# Patient Record
Sex: Female | Born: 1970 | Race: White | Hispanic: No | Smoking: Never smoker
Health system: Southern US, Community
[De-identification: ages and names within clinical notes are randomized; demographics above are authoritative.]

## PROBLEM LIST (undated history)

## (undated) DIAGNOSIS — R109 Unspecified abdominal pain: Secondary | ICD-10-CM

## (undated) DIAGNOSIS — R42 Dizziness and giddiness: Secondary | ICD-10-CM

## (undated) DIAGNOSIS — Z9221 Personal history of antineoplastic chemotherapy: Secondary | ICD-10-CM

## (undated) DIAGNOSIS — R51 Headache: Secondary | ICD-10-CM

## (undated) DIAGNOSIS — R519 Headache, unspecified: Secondary | ICD-10-CM

## (undated) DIAGNOSIS — J302 Other seasonal allergic rhinitis: Secondary | ICD-10-CM

## (undated) DIAGNOSIS — C189 Malignant neoplasm of colon, unspecified: Secondary | ICD-10-CM

## (undated) DIAGNOSIS — K921 Melena: Secondary | ICD-10-CM

## (undated) HISTORY — PX: ABDOMINAL HYSTERECTOMY: SHX81

---

## 2006-10-07 ENCOUNTER — Ambulatory Visit: Payer: Self-pay

## 2007-02-16 ENCOUNTER — Ambulatory Visit: Payer: Self-pay | Admitting: Obstetrics and Gynecology

## 2011-06-11 ENCOUNTER — Ambulatory Visit: Payer: Self-pay | Admitting: General Practice

## 2011-08-06 ENCOUNTER — Ambulatory Visit: Payer: Self-pay | Admitting: Obstetrics and Gynecology

## 2011-08-13 ENCOUNTER — Ambulatory Visit: Payer: Self-pay | Admitting: Obstetrics and Gynecology

## 2011-08-16 LAB — PATHOLOGY REPORT

## 2012-04-04 ENCOUNTER — Ambulatory Visit: Payer: Self-pay | Admitting: Obstetrics and Gynecology

## 2012-10-02 ENCOUNTER — Ambulatory Visit: Payer: Self-pay | Admitting: General Practice

## 2012-11-16 ENCOUNTER — Ambulatory Visit: Payer: Self-pay | Admitting: Physician Assistant

## 2013-04-10 ENCOUNTER — Ambulatory Visit: Payer: Self-pay | Admitting: Obstetrics and Gynecology

## 2013-07-02 ENCOUNTER — Ambulatory Visit: Payer: Self-pay | Admitting: General Practice

## 2014-04-11 ENCOUNTER — Ambulatory Visit: Payer: Self-pay | Admitting: Obstetrics and Gynecology

## 2014-12-15 DIAGNOSIS — C189 Malignant neoplasm of colon, unspecified: Secondary | ICD-10-CM

## 2014-12-15 HISTORY — DX: Malignant neoplasm of colon, unspecified: C18.9

## 2014-12-25 NOTE — Discharge Instructions (Signed)

## 2014-12-27 ENCOUNTER — Ambulatory Visit: Payer: PRIVATE HEALTH INSURANCE | Admitting: Anesthesiology

## 2014-12-27 ENCOUNTER — Encounter
Admission: RE | Disposition: A | Discharge status: Home / Self Care | Payer: Self-pay | Source: Ambulatory Visit | Attending: Gastroenterology

## 2014-12-27 ENCOUNTER — Ambulatory Visit
Admission: RE | Admit: 2014-12-27 | Discharge: 2014-12-27 | Disposition: A | Discharge status: Home / Self Care | Payer: PRIVATE HEALTH INSURANCE | Source: Ambulatory Visit | Attending: Gastroenterology | Admitting: Gastroenterology

## 2014-12-27 ENCOUNTER — Other Ambulatory Visit: Payer: Self-pay | Admitting: Gastroenterology

## 2014-12-27 DIAGNOSIS — R112 Nausea with vomiting, unspecified: Secondary | ICD-10-CM | POA: Insufficient documentation

## 2014-12-27 DIAGNOSIS — C187 Malignant neoplasm of sigmoid colon: Secondary | ICD-10-CM | POA: Insufficient documentation

## 2014-12-27 DIAGNOSIS — Z803 Family history of malignant neoplasm of breast: Secondary | ICD-10-CM | POA: Insufficient documentation

## 2014-12-27 DIAGNOSIS — Z79899 Other long term (current) drug therapy: Secondary | ICD-10-CM | POA: Diagnosis not present

## 2014-12-27 DIAGNOSIS — K2970 Gastritis, unspecified, without bleeding: Secondary | ICD-10-CM | POA: Insufficient documentation

## 2014-12-27 DIAGNOSIS — K921 Melena: Secondary | ICD-10-CM | POA: Diagnosis present

## 2014-12-27 DIAGNOSIS — R42 Dizziness and giddiness: Secondary | ICD-10-CM | POA: Insufficient documentation

## 2014-12-27 DIAGNOSIS — Z882 Allergy status to sulfonamides status: Secondary | ICD-10-CM | POA: Diagnosis not present

## 2014-12-27 DIAGNOSIS — R1013 Epigastric pain: Secondary | ICD-10-CM | POA: Diagnosis not present

## 2014-12-27 DIAGNOSIS — Z90710 Acquired absence of both cervix and uterus: Secondary | ICD-10-CM | POA: Diagnosis not present

## 2014-12-27 DIAGNOSIS — G4489 Other headache syndrome: Secondary | ICD-10-CM | POA: Insufficient documentation

## 2014-12-27 HISTORY — DX: Melena: K92.1

## 2014-12-27 HISTORY — PX: COLONOSCOPY: SHX5424

## 2014-12-27 HISTORY — DX: Dizziness and giddiness: R42

## 2014-12-27 HISTORY — DX: Headache: R51

## 2014-12-27 HISTORY — DX: Unspecified abdominal pain: R10.9

## 2014-12-27 HISTORY — DX: Headache, unspecified: R51.9

## 2014-12-27 HISTORY — PX: ESOPHAGOGASTRODUODENOSCOPY: SHX5428

## 2014-12-27 HISTORY — DX: Other seasonal allergic rhinitis: J30.2

## 2014-12-27 SURGERY — COLONOSCOPY
Anesthesia: General | Wound class: Contaminated

## 2014-12-27 MED ORDER — STERILE WATER FOR IRRIGATION IR SOLN
Status: DC | PRN
  Administered 2014-12-27: 11:00:00

## 2014-12-27 MED ORDER — LACTATED RINGERS IV SOLN
INTRAVENOUS | Status: DC
  Administered 2014-12-27 (×2): via INTRAVENOUS

## 2014-12-27 MED ORDER — PROPOFOL 10 MG/ML IV BOLUS
INTRAVENOUS | Status: DC | PRN
  Administered 2014-12-27 (×3): 30 mg via INTRAVENOUS
  Administered 2014-12-27 (×4): 20 mg via INTRAVENOUS

## 2014-12-27 MED ORDER — LIDOCAINE HCL (CARDIAC) 20 MG/ML IV SOLN
INTRAVENOUS | Status: DC | PRN
  Administered 2014-12-27: 30 mg via INTRAVENOUS

## 2014-12-27 SURGICAL SUPPLY — 38 items
BALLN DILATOR 10-12 8 (BALLOONS)
BALLN DILATOR 12-15 8 (BALLOONS)
BALLN DILATOR 15-18 8 (BALLOONS)
BALLN DILATOR CRE 0-12 8 (BALLOONS)
BALLN DILATOR ESOPH 8 10 CRE (MISCELLANEOUS) IMPLANT
BALLOON DILATOR 12-15 8 (BALLOONS) IMPLANT
BALLOON DILATOR 15-18 8 (BALLOONS) IMPLANT
BALLOON DILATOR CRE 0-12 8 (BALLOONS) IMPLANT
BLOCK BITE 60FR ADLT L/F GRN (MISCELLANEOUS) ×3 IMPLANT
CANISTER SUCT 1200ML W/VALVE (MISCELLANEOUS) ×3 IMPLANT
FCP ESCP3.2XJMB 240X2.8X (MISCELLANEOUS)
FORCEPS BIOP RAD 4 LRG CAP 4 (CUTTING FORCEPS) ×3 IMPLANT
FORCEPS BIOP RJ4 240 W/NDL (MISCELLANEOUS)
FORCEPS ESCP3.2XJMB 240X2.8X (MISCELLANEOUS) IMPLANT
GOWN CVR UNV OPN BCK APRN NK (MISCELLANEOUS) ×2 IMPLANT
GOWN ISOL THUMB LOOP REG UNIV (MISCELLANEOUS) ×4
HEMOCLIP INSTINCT (CLIP) IMPLANT
INJECTOR VARIJECT VIN23 (MISCELLANEOUS) ×1 IMPLANT
KIT CO2 TUBING (TUBING) ×3 IMPLANT
KIT DEFENDO VALVE AND CONN (KITS) IMPLANT
KIT ENDO PROCEDURE OLY (KITS) ×3 IMPLANT
LIGATOR MULTIBAND 6SHOOTER MBL (MISCELLANEOUS) IMPLANT
MARKER SPOT ENDO TATTOO 5ML (MISCELLANEOUS) ×1 IMPLANT
PAD GROUND ADULT SPLIT (MISCELLANEOUS) IMPLANT
SNARE SHORT THROW 13M SML OVAL (MISCELLANEOUS) IMPLANT
SNARE SHORT THROW 30M LRG OVAL (MISCELLANEOUS) IMPLANT
SPOT EX ENDOSCOPIC TATTOO (MISCELLANEOUS) ×2
SUCTION POLY TRAP 4CHAMBER (MISCELLANEOUS) IMPLANT
SYR INFLATION 60ML (SYRINGE) IMPLANT
TRAP SUCTION POLY (MISCELLANEOUS) IMPLANT
TUBING CONN 6MMX3.1M (TUBING)
TUBING SUCTION CONN 0.25 STRL (TUBING) IMPLANT
UNDERPAD 30X60 958B10 (PK) (MISCELLANEOUS) IMPLANT
VALVE BIOPSY ENDO (VALVE) IMPLANT
VARIJECT INJECTOR VIN23 (MISCELLANEOUS) ×3
WATER AUXILLARY (MISCELLANEOUS) IMPLANT
WATER STERILE IRR 500ML POUR (IV SOLUTION) ×3 IMPLANT
WIRE CRE 18-20MM 8CM F G (MISCELLANEOUS) IMPLANT

## 2014-12-27 NOTE — Transfer of Care (Signed)
Immediate Anesthesia Transfer of Care Note  Patient: CARILYN WOOLSTON  Procedure(s) Performed: Procedure(s) with comments: COLONOSCOPY (N/A) - cecum time- unable to reach ESOPHAGOGASTRODUODENOSCOPY (EGD) (N/A)  Patient Location: PACU  Anesthesia Type: General  Level of Consciousness: awake, alert  and patient cooperative  Airway and Oxygen Therapy: Patient Spontanous Breathing and Patient connected to supplemental oxygen  Post-op Assessment: Post-op Vital signs reviewed, Patient's Cardiovascular Status Stable, Respiratory Function Stable, Patent Airway and No signs of Nausea or vomiting  Post-op Vital Signs: Reviewed and stable  Complications: No apparent anesthesia complications

## 2014-12-27 NOTE — H&P (Signed)
  [facility]  [address], [place], [place] [address] Phone: [phone] Fax : [phone]  Primary Care Physician:  Laverta Baltimore, MD Primary Gastroenterologist:  Dr. Allen Norris  Pre-Procedure History & Physical: HPI:  Patricia Macias is a 44 y.o. female is here for an endoscopy and colonoscopy.   Past Medical History  Diagnosis Date  . Hematochezia   . Abdominal pain   . Seasonal allergies   . Vertigo     related to seasons  . Headache     stress related 2-3/week    Past Surgical History  Procedure Laterality Date  . Abdominal hysterectomy      partial    Prior to Admission medications   Medication Sig Start Date End Date Taking? Authorizing Provider  loratadine (CLARITIN) 10 MG tablet Take 10 mg by mouth as needed.    Yes Historical Provider, MD    Allergies as of 12/25/2014 - Review Complete 12/25/2014  Allergen Reaction Noted  . Sulfa antibiotics Hives 12/25/2014    History reviewed. No pertinent family history.  History   Social History  . Marital Status: Divorced    Spouse Name: N/A  . Number of Children: N/A  . Years of Education: N/A   Occupational History  . Not on file.   Social History Main Topics  . Smoking status: Never Smoker   . Smokeless tobacco: Not on file  . Alcohol Use: 1.2 - 1.8 oz/week    2-3 Glasses of wine per week  . Drug Use: Not on file  . Sexual Activity: Not on file   Other Topics Concern  . Not on file   Social History Narrative  . No narrative on file    Review of Systems: See HPI, otherwise negative ROS  Physical Exam: BP 121/84 mmHg  Pulse 82  Temp(Src) 98.2 F (36.8 C) (Temporal)  Resp 16  Ht 5\' 6"  (1.676 m)  Wt 197 lb 12.8 oz (89.721 kg)  BMI 31.94 kg/m2  SpO2 99% General:   Alert,  pleasant and cooperative in NAD Head:  Normocephalic and atraumatic. Neck:  Supple; no masses or thyromegaly. Lungs:  Clear throughout to auscultation.    Heart:  Regular rate and  rhythm. Abdomen:  Soft, nontender and nondistended. Normal bowel sounds, without guarding, and without rebound.   Neurologic:  Alert and  oriented x4;  grossly normal neurologically.  Impression/Plan: Patricia Macias is here for an endoscopy and colonoscopy to be performed for abd pain, dyspepsia, hemtochezina  Risks, benefits, limitations, and alternatives regarding  endoscopy and colonoscopy have been reviewed with the patient.  Questions have been answered.  All parties agreeable.   [facility], MD  12/27/2014, 11:09 AM

## 2014-12-27 NOTE — Op Note (Signed)
[facility] Patient Name: Patricia Macias Procedure Date: 12/27/2014 11:05 AM MRN: [id number] Account #: [id number] Date of Birth: [birth year] Admit Type: Outpatient Age: 44 Room: [facility] OR ROOM 01 Gender: Female Note Status: Finalized Procedure:         Colonoscopy Indications:       Hematochezia Providers:         Lucilla Lame, MD Referring MD:      Boykin Nearing, MD (Referring MD) Medicines:         Propofol per Anesthesia Complications:     No immediate complications. Procedure:         Pre-Anesthesia Assessment:                    - Prior to the procedure, a History and Physical was                     performed, and patient medications and allergies were                     reviewed. The patient's tolerance of previous anesthesia                     was also reviewed. The risks and benefits of the procedure                     and the sedation options and risks were discussed with the                     patient. All questions were answered, and informed consent                     was obtained. Prior Anticoagulants: The patient has taken                     no previous anticoagulant or antiplatelet agents. ASA                     Grade Assessment: II - A patient with mild systemic                     disease. After reviewing the risks and benefits, the                     patient was deemed in satisfactory condition to undergo                     the procedure.                    After obtaining informed consent, the colonoscope was                     passed under direct vision. Throughout the procedure, the                     patient's blood pressure, pulse, and oxygen saturations                     were monitored continuously. The Olympus CF H180AL                     colonoscope (S#: [id number]) was introduced through the anus  and advanced to the the sigmoid colon. The colonoscopy was   performed without difficulty. The patient tolerated the                     procedure well. The quality of the bowel preparation was                     excellent. Findings:      The perianal and digital rectal examinations were normal.      A fungating partially obstructing large mass was found in the sigmoid       colon. The mass was circumferential. Oozing was present. This was       biopsied with a cold forceps for histology. Area was successfully       injected with [place] ink for tattooing. 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 Removal was not done. Biopsied. Injected.                    - Scope could not be passed beyong the mass. Recommendation:    - Await pathology results.                    - Refer to a Psychologist, sport and exercise. Procedure Code(s): --- Professional ---                    [phone], Sigmoidoscopy, flexible; with directed submucosal                     injection(s), any substance                    [id number], Sigmoidoscopy, flexible; with biopsy, single or                     multiple Diagnosis Code(s): --- Professional ---                    K92.1, Melena                    D49.0, Neoplasm of unspecified behavior of digestive system CPT copyright 2014 American Medical Association. All rights reserved. The codes documented in this report are preliminary and upon coder review may  be revised to meet current compliance requirements. Lucilla Lame, MD 12/27/2014 11:40:11 AM This report has been signed electronically. Number of Addenda: 0 Note Initiated On: 12/27/2014 11:05 AM Total Procedure Duration: 0 hours 8 minutes 12 seconds       [facility]

## 2014-12-27 NOTE — Anesthesia Preprocedure Evaluation (Addendum)
Anesthesia Evaluation  Patient identified by MRN, date of birth, ID band Patient awake    Reviewed: Allergy & Precautions, NPO status , Patient's Chart, lab work & pertinent test results  Airway Mallampati: II  TM Distance: >3 FB Neck ROM: Full    Dental no notable dental hx.    Pulmonary neg pulmonary ROS,  breath sounds clear to auscultation  Pulmonary exam normal       Cardiovascular negative cardio ROS Normal cardiovascular examRhythm:Regular Rate:Normal     Neuro/Psych negative neurological ROS  negative psych ROS   GI/Hepatic negative GI ROS, Neg liver ROS,   Endo/Other  negative endocrine ROS  Renal/GU negative Renal ROS  negative genitourinary   Musculoskeletal negative musculoskeletal ROS (+)   Abdominal   Peds negative pediatric ROS (+)  Hematology negative hematology ROS (+)   Anesthesia Other Findings   Reproductive/Obstetrics negative OB ROS                            Anesthesia Physical Anesthesia Plan  ASA: II  Anesthesia Plan: General   Post-op Pain Management:    Induction: Intravenous  Airway Management Planned:   Additional Equipment:   Intra-op Plan:   Post-operative Plan: Extubation in OR  Informed Consent: I have reviewed the patients History and Physical, chart, labs and discussed the procedure including the risks, benefits and alternatives for the proposed anesthesia with the patient or authorized representative who has indicated his/her understanding and acceptance.   Dental advisory given  Plan Discussed with: CRNA  Anesthesia Plan Comments:         Anesthesia Quick Evaluation

## 2014-12-27 NOTE — Anesthesia Postprocedure Evaluation (Signed)
  Anesthesia Post-op Note  Patient: Patricia Macias  Procedure(s) Performed: Procedure(s) with comments: COLONOSCOPY (N/A) - cecum time- unable to reach ESOPHAGOGASTRODUODENOSCOPY (EGD) (N/A)  Anesthesia type:General  Patient location: PACU  Post pain: Pain level controlled  Post assessment: Post-op Vital signs reviewed, Patient's Cardiovascular Status Stable, Respiratory Function Stable, Patent Airway and No signs of Nausea or vomiting  Post vital signs: Reviewed and stable  Last Vitals:  Filed Vitals:   12/27/14 1153  BP:   Pulse:   Temp:   Resp: 14    Level of consciousness: awake, alert  and patient cooperative  Complications: No apparent anesthesia complications

## 2014-12-27 NOTE — Op Note (Signed)
[facility] Patient Name: Patricia Macias Procedure Date: 12/27/2014 11:06 AM MRN: [id number] Account #: [id number] Date of Birth: [birth year] Admit Type: Outpatient Age: 44 Room: [facility] OR ROOM 01 Gender: Female Note Status: Finalized Procedure:         Upper GI endoscopy Indications:       Nausea with vomiting Providers:         Lucilla Lame, MD Referring MD:      Boykin Nearing, MD (Referring MD) Medicines:         Propofol per Anesthesia Complications:     No immediate complications. Procedure:         Pre-Anesthesia Assessment:                    - Prior to the procedure, a History and Physical was                     performed, and patient medications and allergies were                     reviewed. The patient's tolerance of previous anesthesia                     was also reviewed. The risks and benefits of the procedure                     and the sedation options and risks were discussed with the                     patient. All questions were answered, and informed consent                     was obtained. Prior Anticoagulants: The patient has taken                     no previous anticoagulant or antiplatelet agents. ASA                     Grade Assessment: II - A patient with mild systemic                     disease. After reviewing the risks and benefits, the                     patient was deemed in satisfactory condition to undergo                     the procedure.                    After obtaining informed consent, the endoscope was passed                     under direct vision. Throughout the procedure, the                     patient's blood pressure, pulse, and oxygen saturations                     were monitored continuously. The Olympus GIF H180J                     colonscope (T#:[id number]) was introduced through the mouth,  and advanced to the second part of duodenum. The upper GI      endoscopy was accomplished without difficulty. The patient                     tolerated the procedure well. Findings:      The examined esophagus was normal.      Localized minimal inflammation characterized by erythema was found in       the gastric antrum. Biopsies were taken with a cold forceps for       histology.      The examined duodenum was normal. Biopsies were taken with a cold       forceps for histology. Impression:        - Normal esophagus.                    - Gastritis. Biopsied.                    - Normal examined duodenum. Biopsied. Recommendation:    - Await pathology results.                    - Perform a colonoscopy today. Procedure Code(s): --- Professional ---                    [phone], Esophagogastroduodenoscopy, flexible, transoral;                     with biopsy, single or multiple Diagnosis Code(s): --- Professional ---                    R11.2, Nausea with vomiting, unspecified                    K29.70, Gastritis, unspecified, without bleeding CPT copyright 2014 American Medical Association. All rights reserved. The codes documented in this report are preliminary and upon coder review may  be revised to meet current compliance requirements. Lucilla Lame, MD 12/27/2014 11:27:04 AM This report has been signed electronically. Number of Addenda: 0 Note Initiated On: 12/27/2014 11:06 AM Total Procedure Duration: 0 hours 3 minutes 35 seconds       [facility]

## 2014-12-30 ENCOUNTER — Encounter: Payer: Self-pay | Admitting: Gastroenterology

## 2015-01-02 DIAGNOSIS — C787 Secondary malignant neoplasm of liver and intrahepatic bile duct: Secondary | ICD-10-CM

## 2015-01-02 DIAGNOSIS — C189 Malignant neoplasm of colon, unspecified: Secondary | ICD-10-CM | POA: Insufficient documentation

## 2015-01-04 IMAGING — CT CT STONE STUDY
1 of 2 series · 14 of 32 positions shown, 18 images · non-contrast
Comparison: none

REASON FOR EXAM: hematuria
COMMENTS:

[Series 2: abd 3mm wo 3.0 i40f 3 · axial · 0.78mm/px · z∈[-106,+324]mm · 14 of 161 slices shown, 18 images]
[im 12/161  soft-tissue]
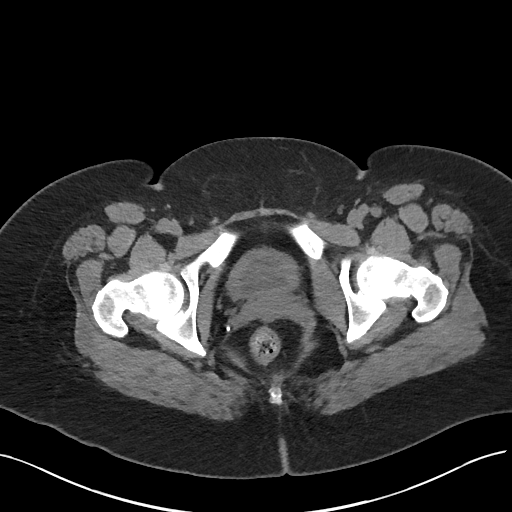
[im 12/161  bone]
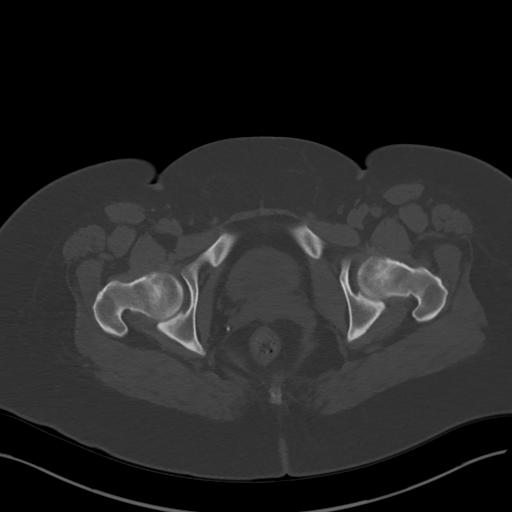
[im 24/161  soft-tissue]
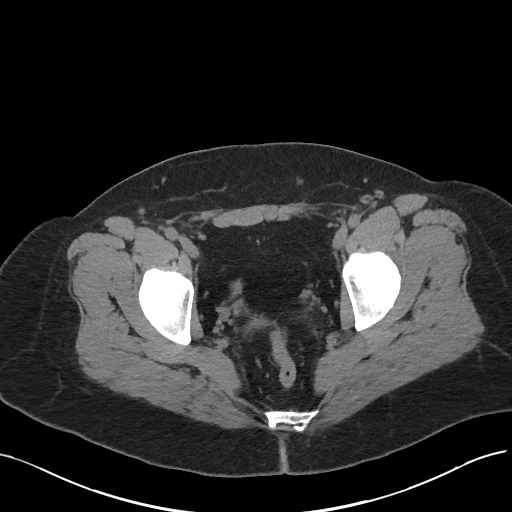
[im 36/161  soft-tissue]
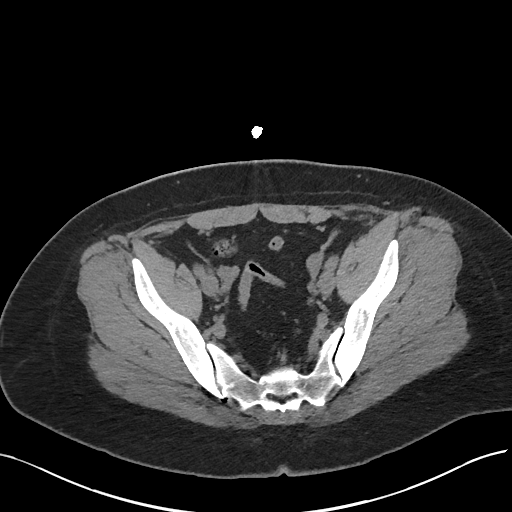
[im 48/161  soft-tissue]
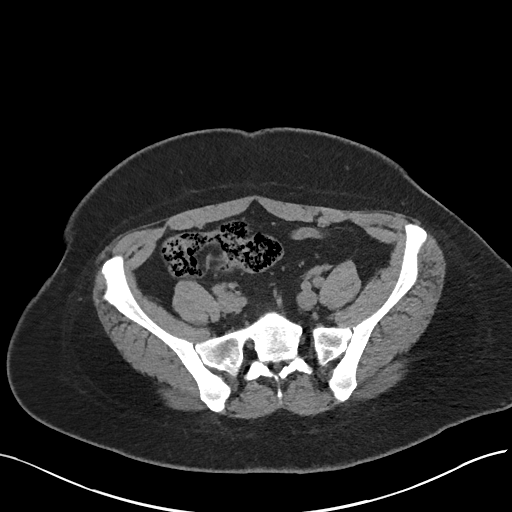
[im 60/161  soft-tissue]
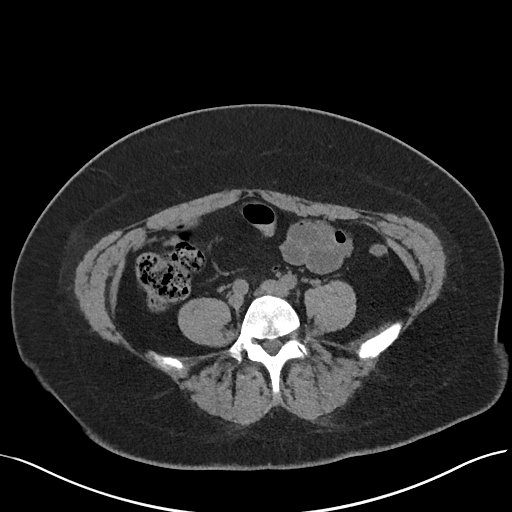
[im 72/161  soft-tissue]
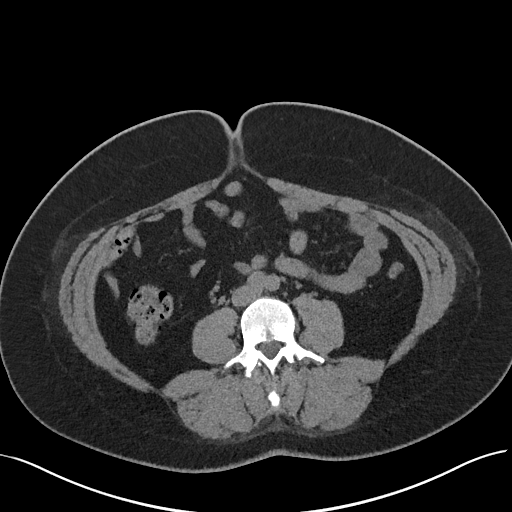
[im 89/161  soft-tissue]
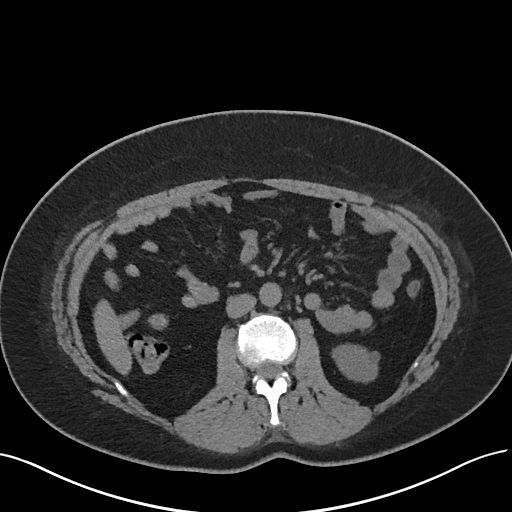
[im 101/161  soft-tissue]
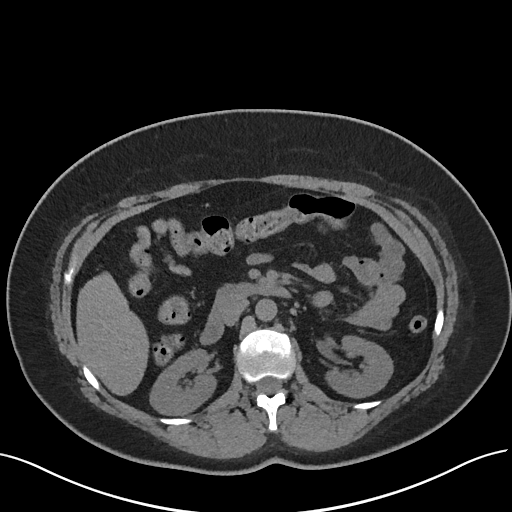
[im 113/161  soft-tissue]
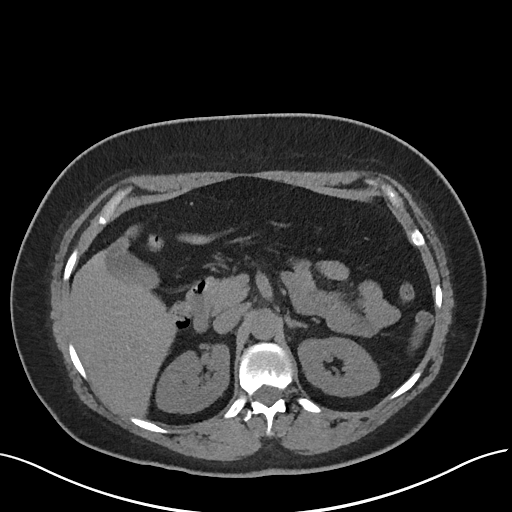
[im 113/161  bone]
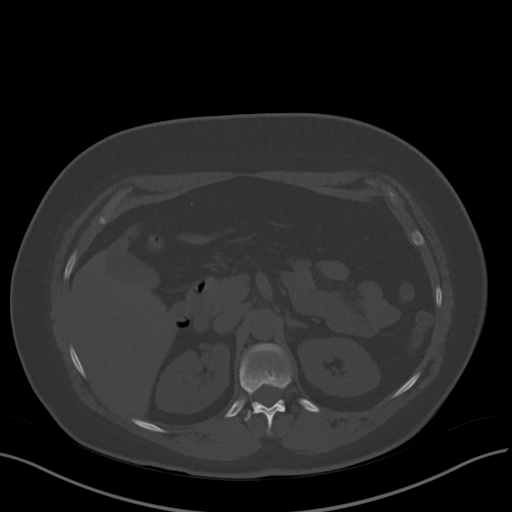
[im 125/161  soft-tissue]
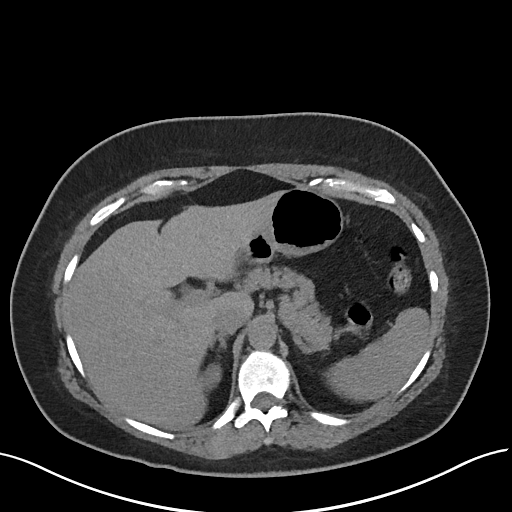
[im 137/161  soft-tissue]
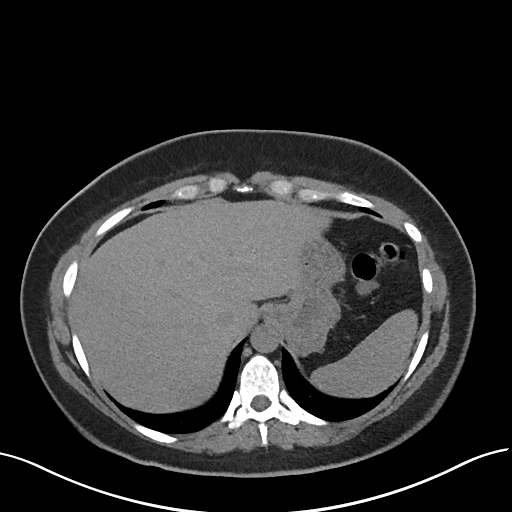
[im 137/161  lung]
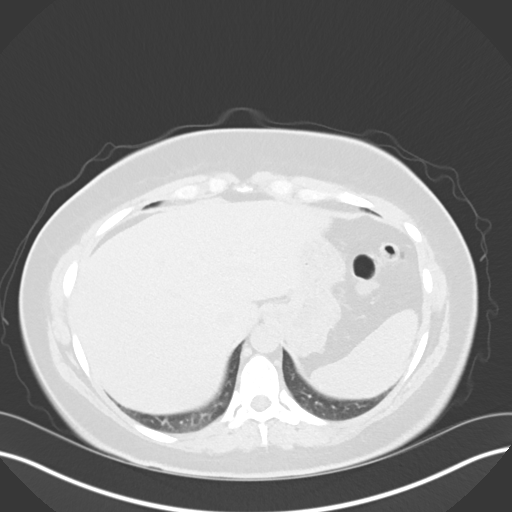
[im 143/161  lung]
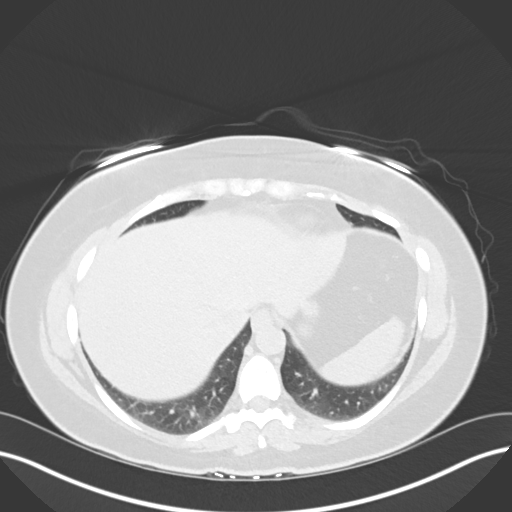
[im 149/161  soft-tissue]
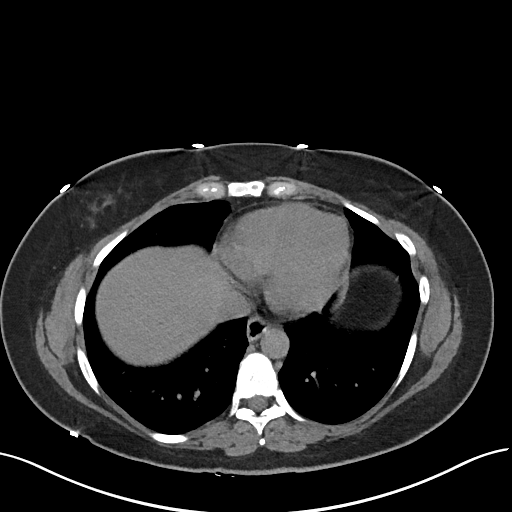
[im 149/161  lung]
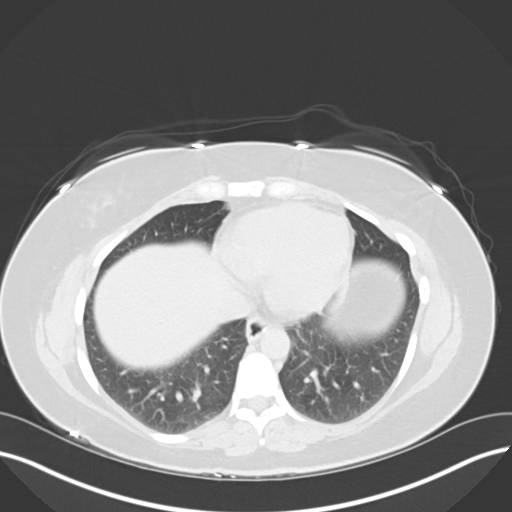
[im 155/161  lung]
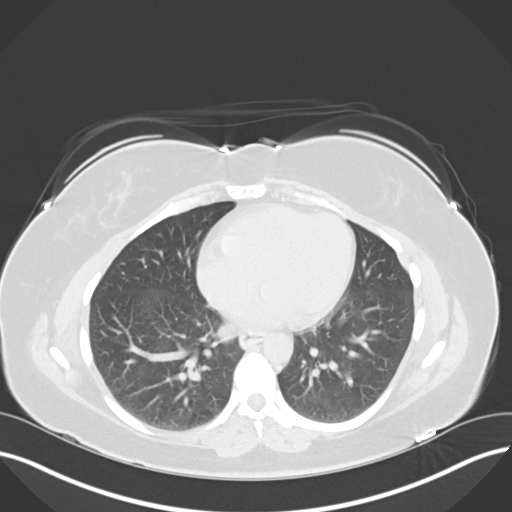

[14 of 32 positions shown; findings below may reference images not displayed]

PROCEDURE:     KCT - KCT ABDOMEN/PELVIS WO (STONE)  - October 02, 2012  [DATE]

RESULT:     Axial noncontrast CT scanning was performed through the abdomen
and pelvis with reconstructions at 3 mm intervals and slice thicknesses.
Review of multiplanar reconstructed images was performed separately on the
VIA monitor.

The kidneys are normal in contour. There is an 8mm diameter exophytic
hypodensity from the lower pole of the left kidney with Hounsfield
measurement of -18. There are no calcified stones within either renal
collecting system. The perinephric fat is normal in density. No abnormal
parenchymal densities are demonstrated. The ureters are normal in course and
caliber. The urinary bladder is decompressed. No ureterovesical junction
stone is demonstrated.

The uterus is surgically absent. There are no adnexal masses. There is a 2
cm diameter cyst associated with the right ovary.

The liver, pancreas, spleen, nondistended stomach, and adrenal glands are
normal in appearance. There is subtle radiodensity in the gallbladder which
may reflect sludge. No discrete calcified stones are demonstrated. The
caliber of the abdominal aorta is normal. The psoas musculature also appears
normal. The unopacified loops of small and large bowel exhibit no evidence
of ileus nor of obstruction. A normal calibered partially gas-filled
appendix is demonstrated on images 104 through 109. There is a small fat
containing umbilical hernia. There is no inguinal hernia. The lumbar
vertebral bodies are preserved in height. The lung bases are clear.
IMPRESSION: 1. There is no evidence of urinary tract obstruction nor of calcified
urinary tract stones. There is a tiny exophytic structure from the lower
pole of the left kidney measuring 8 mm in diameter with Hounsfield
measurement of -18 most compatible with a cyst. No abnormality of the
ureters and urinary bladder is demonstrated but the urinary bladder is
collapsed.
2. There is likely 2 cm diameter right ovarian cyst.
3. There is no acute bowel abnormality.
4. I cannot exclude sludge within the gallbladder.

If the patient's hematuria persists and remains unexplained, followup
triphasic CT scanning may be indicated.

[REDACTED]

## 2015-05-16 ENCOUNTER — Ambulatory Visit: Payer: Self-pay | Admitting: Physician Assistant

## 2015-05-16 ENCOUNTER — Encounter: Payer: Self-pay | Admitting: Physician Assistant

## 2015-05-16 VITALS — BP 110/70 | HR 84 | Temp 97.9°F

## 2015-05-16 DIAGNOSIS — J302 Other seasonal allergic rhinitis: Secondary | ICD-10-CM

## 2015-05-16 DIAGNOSIS — N390 Urinary tract infection, site not specified: Secondary | ICD-10-CM

## 2015-05-16 DIAGNOSIS — R319 Hematuria, unspecified: Principal | ICD-10-CM

## 2015-05-16 LAB — POCT URINALYSIS DIPSTICK
Glucose, UA: NEGATIVE
Ketones, UA: NEGATIVE
Nitrite, UA: NEGATIVE
PH UA: 6
Spec Grav, UA: >=1.03
Urobilinogen, UA: 0.2

## 2015-05-16 MED ORDER — CIPROFLOXACIN HCL 250 MG PO TABS: 250.0000 mg | ORAL_TABLET | Freq: Two times a day (BID) | ORAL | Status: DC

## 2015-05-16 MED ORDER — LORATADINE 10 MG PO TABS
10.0000 mg | ORAL_TABLET | Freq: Every day | ORAL | Status: DC | PRN
Start: 2015-05-16 — End: 2016-02-05

## 2015-05-16 NOTE — Progress Notes (Signed)
S:  C/o uti sx for 2 days, burning, urgency, frequency, denies vaginal discharge, abdominal pain or flank pain:  Remainder ros neg  O:  Vitals wnl, nad, no cva tenderness, back nontender, lungs c t a,cv rrr,  n/v intact  A: uti  P: cipro 250mg  bid x 7d, increase water intake, add cranberry juice, return if not improving in 2 -3 days, return earlier if worsening, discussed pyelonephritis sx

## 2015-05-16 NOTE — Addendum Note (Signed)
Addended by: Versie Starks on: 05/16/2015 03:51 PM   Modules accepted: Orders

## 2015-07-25 ENCOUNTER — Encounter: Payer: Self-pay | Admitting: Physician Assistant

## 2015-07-25 ENCOUNTER — Ambulatory Visit: Payer: Self-pay | Admitting: Physician Assistant

## 2015-07-25 VITALS — BP 120/70 | HR 104 | Temp 97.8°F

## 2015-07-25 DIAGNOSIS — T148XXA Other injury of unspecified body region, initial encounter: Principal | ICD-10-CM

## 2015-07-25 DIAGNOSIS — L089 Local infection of the skin and subcutaneous tissue, unspecified: Secondary | ICD-10-CM

## 2015-07-25 MED ORDER — CEPHALEXIN 500 MG PO CAPS: 500.0000 mg | ORAL_CAPSULE | Freq: Three times a day (TID) | ORAL | Status: DC

## 2015-07-25 NOTE — Progress Notes (Signed)
S: pt here for eval of suture line, thinks its infected, had part of her liver removed in November, area had been healing well, now has 2 open areas that are draining, area surrounding this area is warm and red, pt also has colostomy bag, denies fever/chills, is just really tired  O: vitals wnl, nad, skin with healing incision except for area near umbilicus, area has opened and is draining , skin surrounding is pink and warm, a little tender  A: infected wound vs reaction to desolvable sutures  P: keflex 500mg  tid, f/u with surgeon on Mon am

## 2015-10-30 ENCOUNTER — Encounter: Payer: Self-pay | Admitting: Physician Assistant

## 2015-10-30 ENCOUNTER — Ambulatory Visit: Payer: Self-pay | Admitting: Physician Assistant

## 2015-10-30 VITALS — BP 119/79 | HR 88 | Temp 98.5°F

## 2015-10-30 DIAGNOSIS — L089 Local infection of the skin and subcutaneous tissue, unspecified: Secondary | ICD-10-CM

## 2015-10-30 DIAGNOSIS — T148XXA Other injury of unspecified body region, initial encounter: Secondary | ICD-10-CM

## 2015-10-30 DIAGNOSIS — G4700 Insomnia, unspecified: Secondary | ICD-10-CM

## 2015-10-30 DIAGNOSIS — L0291 Cutaneous abscess, unspecified: Secondary | ICD-10-CM

## 2015-10-30 MED ORDER — ZOLPIDEM TARTRATE 10 MG PO TABS: 10.0000 mg | ORAL_TABLET | Freq: Every evening | ORAL | Status: DC | PRN

## 2015-10-30 MED ORDER — CEPHALEXIN 500 MG PO CAPS: 500.0000 mg | ORAL_CAPSULE | Freq: Three times a day (TID) | ORAL | Status: DC

## 2015-10-30 NOTE — Progress Notes (Signed)
S: has open oozing area on r upper thigh, a hard white area had been there for years then turned black with the chemo, now its red and oozing, denies fever chills, last chemo was 3 weeks ago, also states her Lorrin Mais has stopped working, is on 5mg  qhs  O: vitals wnl, nad, skin with open oozing area, when area pressed cheesy white material typical of sebaceous cyst comes out, has slight odor typical of sebaceous cyst, n/v intact, bandage applied  A: insomnia, abscess  P: ambien 10mg  qhs, #30 3 refills, keflex 500mg  tid, return if area is worsening, apply warm wet compress

## 2016-02-05 ENCOUNTER — Ambulatory Visit: Payer: Self-pay | Admitting: Physician Assistant

## 2016-02-05 ENCOUNTER — Encounter: Payer: Self-pay | Admitting: Physician Assistant

## 2016-02-05 VITALS — BP 130/90 | HR 90 | Temp 98.6°F

## 2016-02-05 DIAGNOSIS — G894 Chronic pain syndrome: Secondary | ICD-10-CM

## 2016-02-05 MED ORDER — OXYCODONE HCL 5 MG PO TABS: 5.0000 mg | ORAL_TABLET | Freq: Four times a day (QID) | ORAL | Status: AC | PRN

## 2016-02-05 NOTE — Progress Notes (Signed)
S: wound check, states been having a little pain around the wound as is having to use stairs since elevator is out of commission, getting ready to go on vacation and there will be a lot of stairs there, is concerned she will be in pain and need something, has had a refill on oxycodone since December  O: vitals wnl, nad, wound appears well approximated where colostomy bag was, no drainage, no redness, n/v intact  A: chronic pain  P: oxycodone 5mg  #15 nr

## 2016-03-10 ENCOUNTER — Other Ambulatory Visit: Payer: Self-pay

## 2016-03-10 DIAGNOSIS — Z299 Encounter for prophylactic measures, unspecified: Secondary | ICD-10-CM

## 2016-03-10 NOTE — Progress Notes (Signed)
Patient came in to have blood drawn per Dr. Ouida Sills at [facility] orders.

## 2016-03-11 LAB — LIPID PANEL
CHOL/HDL RATIO: 2.3 ratio (ref 0.0–4.4)
Cholesterol, Total: 158 mg/dL (ref 100–199)
HDL: 68 mg/dL (ref >39)
LDL Calculated: 75 mg/dL (ref 0–99)
Triglycerides: 74 mg/dL (ref 0–149)
VLDL Cholesterol Cal: 15 mg/dL (ref 5–40)

## 2016-03-11 LAB — BASIC METABOLIC PANEL
BUN/Creatinine Ratio: 17 (ref 9–23)
BUN: 13 mg/dL (ref 6–24)
CHLORIDE: 101 mmol/L (ref 96–106)
CO2: 23 mmol/L (ref 18–29)
Calcium: 8.8 mg/dL (ref 8.7–10.2)
Creatinine, Ser: 0.76 mg/dL (ref 0.57–1.00)
GFR, EST AFRICAN AMERICAN: 110 mL/min/{1.73_m2} (ref >59)
GFR, EST NON AFRICAN AMERICAN: 96 mL/min/{1.73_m2} (ref >59)
Glucose: 82 mg/dL (ref 65–99)
POTASSIUM: 3.9 mmol/L (ref 3.5–5.2)
SODIUM: 139 mmol/L (ref 134–144)

## 2016-05-14 ENCOUNTER — Ambulatory Visit: Payer: Self-pay | Admitting: Physician Assistant

## 2016-05-14 ENCOUNTER — Encounter: Payer: Self-pay | Admitting: Physician Assistant

## 2016-05-14 VITALS — BP 130/80 | HR 87 | Temp 98.3°F

## 2016-05-14 DIAGNOSIS — J029 Acute pharyngitis, unspecified: Secondary | ICD-10-CM

## 2016-05-14 LAB — POCT RAPID STREP A (OFFICE): RAPID STREP A SCREEN: NEGATIVE

## 2016-05-14 MED ORDER — PSEUDOEPH-BROMPHEN-DM 30-2-10 MG/5ML PO SYRP: 5.0000 mL | ORAL_SOLUTION | Freq: Four times a day (QID) | ORAL | 0 refills | Status: DC | PRN

## 2016-05-14 NOTE — Progress Notes (Addendum)
   Subjective:    Patient ID: Patricia Macias, female    DOB: August 16, 1971, 45 y.o.   MRN: [id number]  HPI Patient c/o sore throat for 2 days. States post nasal drainage and cough for same amount of time. Denies dysphagia. Denies N/V/D. No palliative measure for compliant.   Review of Systems Negative except for compliant.    Objective:   Physical Exam HEENT remarkable for post nasal drainage and mild erythema to posterior pharynx. No cervical adenopathy. Lungs CTA and Heart RRR. Rapid Strep test was negative.       Assessment & Plan:  Viral pharyngitis. Patient given supportive care instructions and prescription for Bromfed DM. Follow up with [facility].

## 2016-05-14 NOTE — Addendum Note (Signed)
Addended by: Sable Feil on: 05/14/2016 11:00 AM   Modules accepted: Orders

## 2016-05-31 ENCOUNTER — Other Ambulatory Visit: Payer: Self-pay | Admitting: Physician Assistant

## 2016-05-31 ENCOUNTER — Other Ambulatory Visit: Payer: Self-pay | Admitting: Obstetrics and Gynecology

## 2016-05-31 DIAGNOSIS — Z1231 Encounter for screening mammogram for malignant neoplasm of breast: Secondary | ICD-10-CM

## 2016-05-31 DIAGNOSIS — G4700 Insomnia, unspecified: Secondary | ICD-10-CM

## 2016-06-02 ENCOUNTER — Ambulatory Visit
Admission: RE | Admit: 2016-06-02 | Discharge: 2016-06-02 | Disposition: A | Discharge status: Home / Self Care | Payer: Managed Care, Other (non HMO) | Source: Ambulatory Visit | Attending: Obstetrics and Gynecology | Admitting: Obstetrics and Gynecology

## 2016-06-02 DIAGNOSIS — Z1231 Encounter for screening mammogram for malignant neoplasm of breast: Secondary | ICD-10-CM | POA: Insufficient documentation

## 2016-06-02 DIAGNOSIS — R928 Other abnormal and inconclusive findings on diagnostic imaging of breast: Secondary | ICD-10-CM | POA: Insufficient documentation

## 2016-06-02 HISTORY — DX: Malignant neoplasm of colon, unspecified: C18.9

## 2016-06-02 HISTORY — DX: Personal history of antineoplastic chemotherapy: Z92.21

## 2016-06-07 ENCOUNTER — Other Ambulatory Visit: Payer: Self-pay | Admitting: Obstetrics and Gynecology

## 2016-06-07 DIAGNOSIS — N6489 Other specified disorders of breast: Secondary | ICD-10-CM

## 2016-06-24 ENCOUNTER — Ambulatory Visit: Payer: Managed Care, Other (non HMO) | Attending: Obstetrics and Gynecology

## 2016-06-24 ENCOUNTER — Other Ambulatory Visit: Payer: Self-pay

## 2016-07-22 ENCOUNTER — Ambulatory Visit
Admission: RE | Admit: 2016-07-22 | Discharge: 2016-07-22 | Disposition: A | Discharge status: Home / Self Care | Payer: Managed Care, Other (non HMO) | Source: Ambulatory Visit | Attending: Obstetrics and Gynecology | Admitting: Obstetrics and Gynecology

## 2016-07-22 DIAGNOSIS — N6489 Other specified disorders of breast: Secondary | ICD-10-CM | POA: Insufficient documentation

## 2016-07-26 ENCOUNTER — Telehealth: Payer: Self-pay | Admitting: Emergency Medicine

## 2016-07-26 MED ORDER — FLUCONAZOLE 150 MG PO TABS: 150.0000 mg | ORAL_TABLET | Freq: Once | ORAL | 0 refills | Status: AC

## 2016-07-26 MED ORDER — CEFDINIR 300 MG PO CAPS: 300.0000 mg | ORAL_CAPSULE | Freq: Two times a day (BID) | ORAL | 0 refills | Status: DC

## 2016-07-26 NOTE — Telephone Encounter (Signed)
Pt called office and states she is having sx of sinus infection, is undergoing radiation treatments and is afraid to come to the office due to her immune system, ?if we could call in an antibiotic  Called in omnicef , dilfucan x [facility]

## 2016-07-26 NOTE — Telephone Encounter (Signed)
Patient called and expressed that she has developed a sinus infection.  She has a sorethroat, productive cough and congestion and has color in her mucus. She wants to know if we can call in an antibiotic.

## 2016-08-03 ENCOUNTER — Other Ambulatory Visit: Payer: Self-pay | Admitting: Obstetrics and Gynecology

## 2016-08-03 DIAGNOSIS — N63 Unspecified lump in unspecified breast: Secondary | ICD-10-CM

## 2016-09-08 ENCOUNTER — Other Ambulatory Visit: Payer: Self-pay

## 2016-09-08 DIAGNOSIS — Z299 Encounter for prophylactic measures, unspecified: Secondary | ICD-10-CM

## 2016-09-08 NOTE — Addendum Note (Signed)
Addended by: Rudene Anda T on: 09/08/2016 12:11 PM   Modules accepted: Level of Service

## 2016-09-08 NOTE — Progress Notes (Signed)
Patient came in to have labs done per Dr. Catalina Antigua Nelson's orders.

## 2016-09-10 LAB — URINE CULTURE

## 2016-11-03 ENCOUNTER — Ambulatory Visit: Payer: Self-pay | Admitting: Physician Assistant

## 2016-11-03 DIAGNOSIS — G4701 Insomnia due to medical condition: Secondary | ICD-10-CM

## 2016-11-03 MED ORDER — ZOLPIDEM TARTRATE 10 MG PO TABS: 10.0000 mg | ORAL_TABLET | Freq: Every evening | ORAL | 3 refills | Status: DC | PRN

## 2016-11-03 NOTE — Progress Notes (Signed)
S:  Refill medication.  Taking Ambien every night for sleep.  No other complaints A:  Insomnia P:  Ambien 10mg   #30 x 3

## 2016-11-17 ENCOUNTER — Ambulatory Visit: Payer: Self-pay | Admitting: Physician Assistant

## 2016-11-17 ENCOUNTER — Encounter: Payer: Self-pay | Admitting: Physician Assistant

## 2016-11-17 VITALS — BP 110/70 | HR 86 | Temp 98.4°F | Ht 66.0 in | Wt 175.0 lb

## 2016-11-17 DIAGNOSIS — Z0189 Encounter for other specified special examinations: Secondary | ICD-10-CM

## 2016-11-17 DIAGNOSIS — Z008 Encounter for other general examination: Secondary | ICD-10-CM

## 2016-11-17 DIAGNOSIS — N838 Other noninflammatory disorders of ovary, fallopian tube and broad ligament: Secondary | ICD-10-CM | POA: Insufficient documentation

## 2016-11-17 DIAGNOSIS — Z0000 Encounter for general adult medical examination without abnormal findings: Secondary | ICD-10-CM

## 2016-11-17 NOTE — Progress Notes (Signed)
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

## 2016-12-09 ENCOUNTER — Other Ambulatory Visit: Payer: Self-pay

## 2016-12-09 DIAGNOSIS — N390 Urinary tract infection, site not specified: Secondary | ICD-10-CM

## 2016-12-09 NOTE — Progress Notes (Signed)
Patient came in to have lab work done per Dr. Sharol Given orders.

## 2016-12-11 LAB — URINE CULTURE: ORGANISM ID, BACTERIA: NO GROWTH

## 2017-01-24 ENCOUNTER — Ambulatory Visit
Admission: RE | Admit: 2017-01-24 | Discharge: 2017-01-24 | Disposition: A | Discharge status: Home / Self Care | Payer: Managed Care, Other (non HMO) | Source: Ambulatory Visit | Attending: Obstetrics and Gynecology | Admitting: Obstetrics and Gynecology

## 2017-01-24 DIAGNOSIS — N63 Unspecified lump in unspecified breast: Secondary | ICD-10-CM

## 2017-01-24 DIAGNOSIS — N6320 Unspecified lump in the left breast, unspecified quadrant: Secondary | ICD-10-CM | POA: Insufficient documentation

## 2017-01-26 ENCOUNTER — Telehealth: Payer: Self-pay | Admitting: Emergency Medicine

## 2017-01-26 MED ORDER — VALACYCLOVIR HCL 1 G PO TABS: 1000.0000 mg | ORAL_TABLET | Freq: Two times a day (BID) | ORAL | 0 refills | Status: AC

## 2017-01-26 NOTE — Telephone Encounter (Signed)
Patient called and expressed that she has developed a fever blister.  Wanted to know if we can call something in to [facility] [place].

## 2017-03-14 ENCOUNTER — Other Ambulatory Visit: Payer: Self-pay

## 2017-03-14 DIAGNOSIS — G4701 Insomnia due to medical condition: Secondary | ICD-10-CM

## 2017-03-14 MED ORDER — ZOLPIDEM TARTRATE 10 MG PO TABS: 10.0000 mg | ORAL_TABLET | Freq: Every evening | ORAL | 3 refills | Status: AC | PRN

## 2017-03-14 NOTE — Progress Notes (Signed)
S: pt here for labs, has stopped doing any chemo treatments, is going to try a "natural way" as she doesn't want to live on chemotherapy for the remaining time she has; uses ambien to sleep, ?if could get refill, is here for labs today bc they will be replacing a stent to her liver this Friday  O: vitals wnl, nad, neuro and mood good  A: insomnia  P: ambien 10mg  qhs #30 3 refills

## 2017-03-14 NOTE — Addendum Note (Signed)
Addended by: Rudene Anda T on: 03/14/2017 10:06 AM   Modules accepted: Orders

## 2017-03-15 LAB — CMP12+LP+TP+TSH+6AC+CBC/D/PLT
ALT: 12 IU/L (ref 0–32)
AST: 25 IU/L (ref 0–40)
Albumin/Globulin Ratio: 1.1 — ABNORMAL LOW (ref 1.2–2.2)
Albumin: 3.9 g/dL (ref 3.5–5.5)
Alkaline Phosphatase: 103 IU/L (ref 39–117)
BUN/Creatinine Ratio: 15 (ref 9–23)
BUN: 13 mg/dL (ref 6–24)
Basophils Absolute: 0 x10E3/uL (ref 0.0–0.2)
Basos: 1 %
Bilirubin Total: 0.5 mg/dL (ref 0.0–1.2)
Calcium: 9.4 mg/dL (ref 8.7–10.2)
Chloride: 99 mmol/L (ref 96–106)
Chol/HDL Ratio: 3.1 ratio (ref 0.0–4.4)
Cholesterol, Total: 167 mg/dL (ref 100–199)
Creatinine, Ser: 0.84 mg/dL (ref 0.57–1.00)
EOS (ABSOLUTE): 0.1 x10E3/uL (ref 0.0–0.4)
Eos: 4 %
Estimated CHD Risk: <0.5 times avg. (ref 0.0–1.0)
Free Thyroxine Index: 1.9 (ref 1.2–4.9)
GFR calc Af Amer: 97 mL/min/1.73 (ref >59)
GFR calc non Af Amer: 84 mL/min/1.73 (ref >59)
GGT: 41 IU/L (ref 0–60)
Globulin, Total: 3.4 g/dL (ref 1.5–4.5)
Glucose: 94 mg/dL (ref 65–99)
HDL: 54 mg/dL (ref >39)
Hematocrit: 35.2 % (ref 34.0–46.6)
Hemoglobin: 11.6 g/dL (ref 11.1–15.9)
Immature Grans (Abs): 0 x10E3/uL (ref 0.0–0.1)
Immature Granulocytes: 0 %
Iron: 29 ug/dL (ref 27–159)
LDH: 287 IU/L — ABNORMAL HIGH (ref 119–226)
LDL Calculated: 99 mg/dL (ref 0–99)
Lymphocytes Absolute: 0.5 x10E3/uL — ABNORMAL LOW (ref 0.7–3.1)
Lymphs: 14 %
MCH: 27.5 pg (ref 26.6–33.0)
MCHC: 33 g/dL (ref 31.5–35.7)
MCV: 83 fL (ref 79–97)
Monocytes Absolute: 0.2 x10E3/uL (ref 0.1–0.9)
Monocytes: 7 %
Neutrophils Absolute: 2.5 x10E3/uL (ref 1.4–7.0)
Neutrophils: 74 %
Phosphorus: 3.5 mg/dL (ref 2.5–4.5)
Platelets: 131 x10E3/uL — ABNORMAL LOW (ref 150–379)
Potassium: 3.4 mmol/L — ABNORMAL LOW (ref 3.5–5.2)
RBC: 4.22 x10E6/uL (ref 3.77–5.28)
RDW: 16.9 % — ABNORMAL HIGH (ref 12.3–15.4)
Sodium: 141 mmol/L (ref 134–144)
T3 Uptake Ratio: 33 % (ref 24–39)
T4, Total: 5.8 ug/dL (ref 4.5–12.0)
TSH: 3.89 u[IU]/mL (ref 0.450–4.500)
Total Protein: 7.3 g/dL (ref 6.0–8.5)
Triglycerides: 72 mg/dL (ref 0–149)
Uric Acid: 5.6 mg/dL (ref 2.5–7.1)
VLDL Cholesterol Cal: 14 mg/dL (ref 5–40)
WBC: 3.3 x10E3/uL — ABNORMAL LOW (ref 3.4–10.8)

## 2017-03-16 LAB — URINALYSIS, ROUTINE W REFLEX MICROSCOPIC
Bilirubin, UA: NEGATIVE
GLUCOSE, UA: NEGATIVE
Ketones, UA: NEGATIVE
Nitrite, UA: NEGATIVE
Specific Gravity, UA: 1.016 (ref 1.005–1.030)
UUROB: 0.2 mg/dL (ref 0.2–1.0)
pH, UA: 5.5 (ref 5.0–7.5)

## 2017-03-16 LAB — MICROSCOPIC EXAMINATION: Casts: NONE SEEN /lpf

## 2017-03-16 LAB — URINE CULTURE

## 2017-04-26 ENCOUNTER — Telehealth: Payer: Self-pay | Admitting: Emergency Medicine

## 2017-04-26 MED ORDER — AMOXICILLIN 875 MG PO TABS: 875.0000 mg | ORAL_TABLET | Freq: Two times a day (BID) | ORAL | 0 refills | Status: DC

## 2017-04-26 MED ORDER — FLUCONAZOLE 150 MG PO TABS: ORAL_TABLET | ORAL | 0 refills | Status: DC

## 2017-04-26 NOTE — Telephone Encounter (Signed)
Patient called and expressed that she is having a lot of head congestion and feeling really bad. She feels like its all in her sinuses.  [facility] uses [facility] [place].

## 2017-04-26 NOTE — Telephone Encounter (Signed)
Pt called and has a lot of head congestion, pt is CA pt., doesn't want to come to clinic due to germs, states its not chest congestion and no difficulty breathing, Called in amoxil to pharmacy, diflucan if needed

## 2017-06-27 IMAGING — US US BREAST*L* LIMITED INC AXILLA
1 series · 7 of 7 positions shown · non-contrast
Comparison: Previous exam(s).

CLINICAL DATA: Left superior breast asymmetry seen on most recent
screening mammography.

EXAM:
2D DIGITAL DIAGNOSTIC LEFT MAMMOGRAM WITH CAD AND ADJUNCT TOMO
ULTRASOUND LEFT BREAST

[Series 2: us breast*left* limited inc axilla · 0.05mm/px · 7 of 7 slices shown]
[im 1/7]
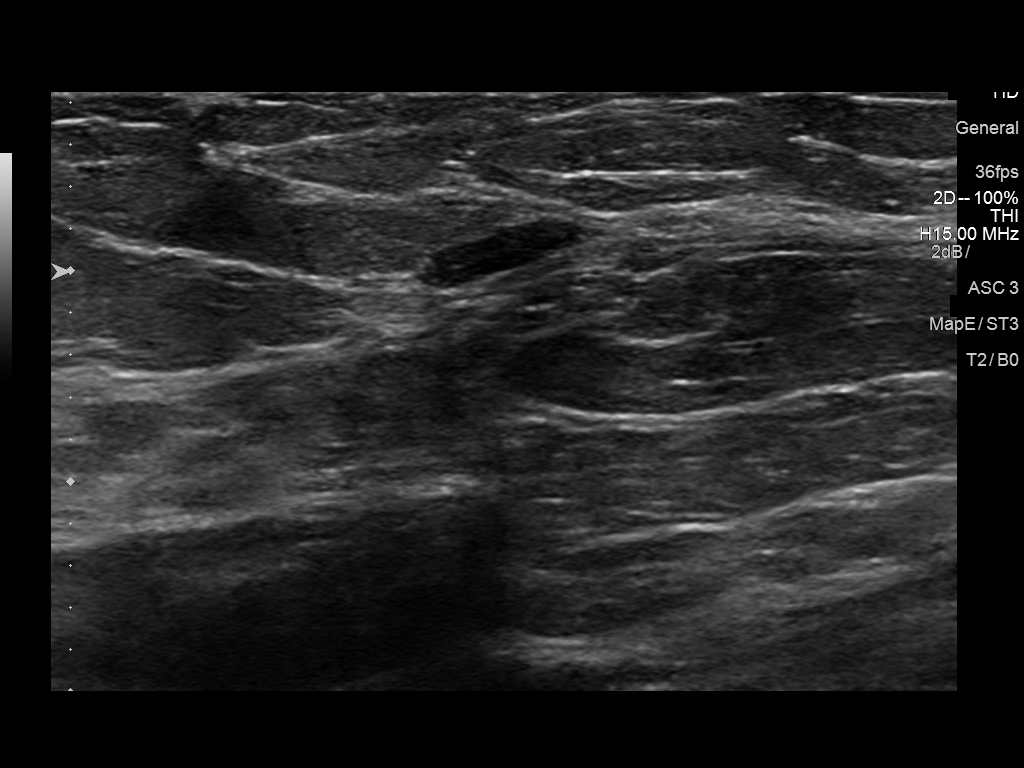
[im 2/7]
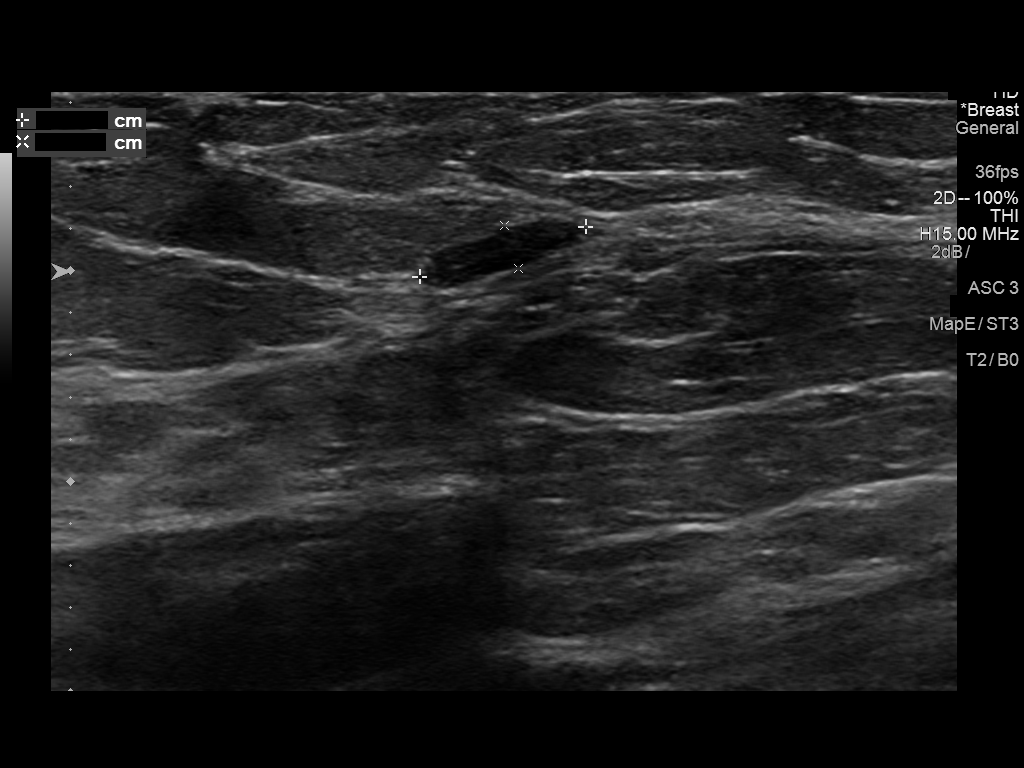
[im 3/7]
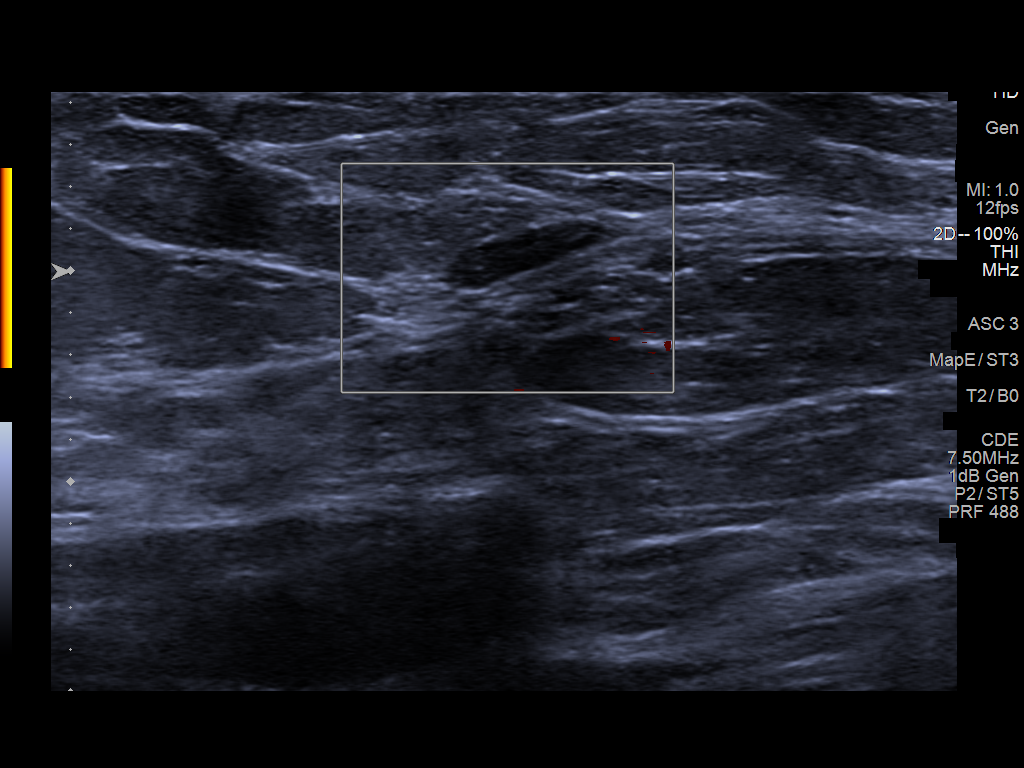
[im 4/7]
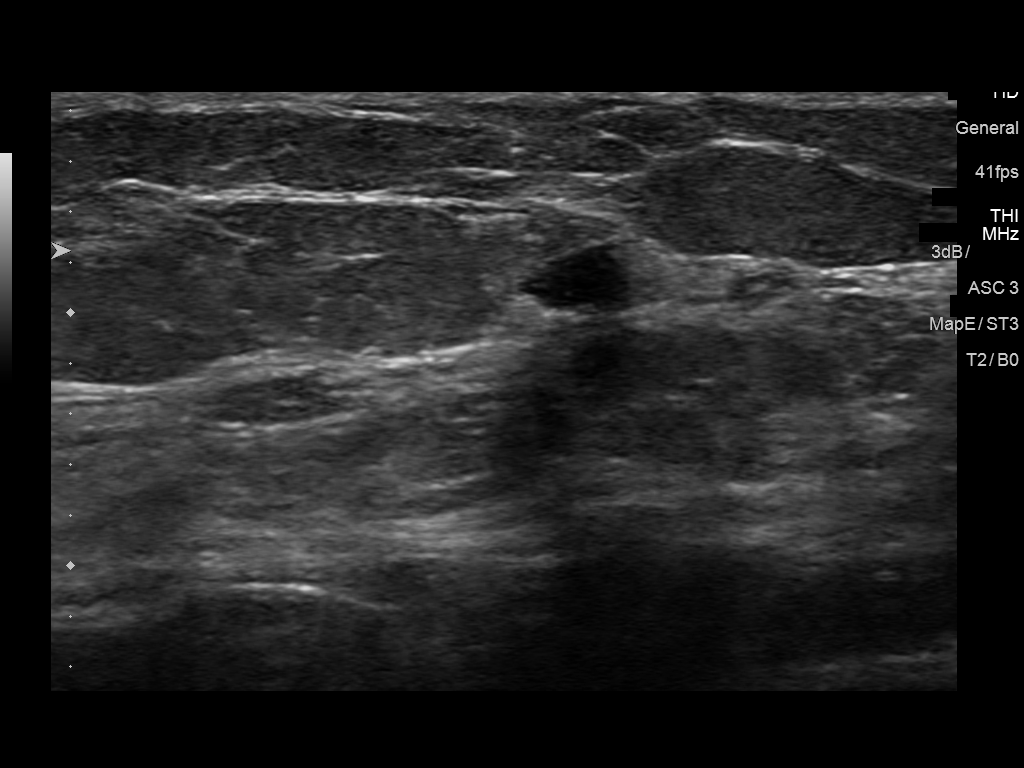
[im 5/7]
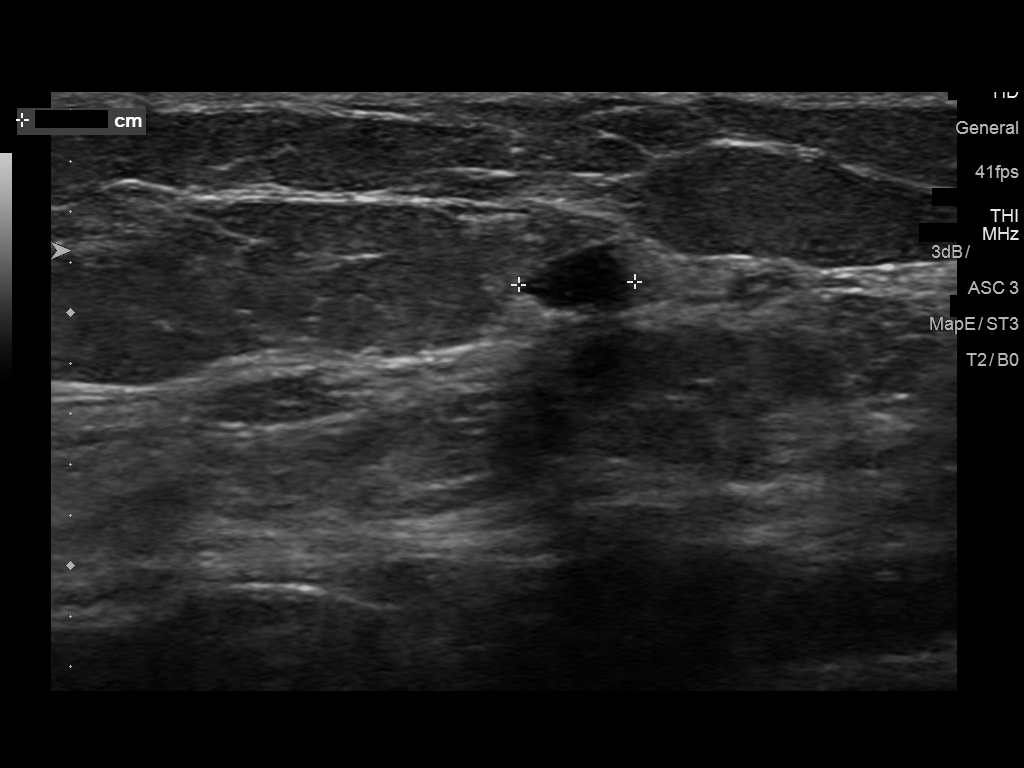
[im 6/7]
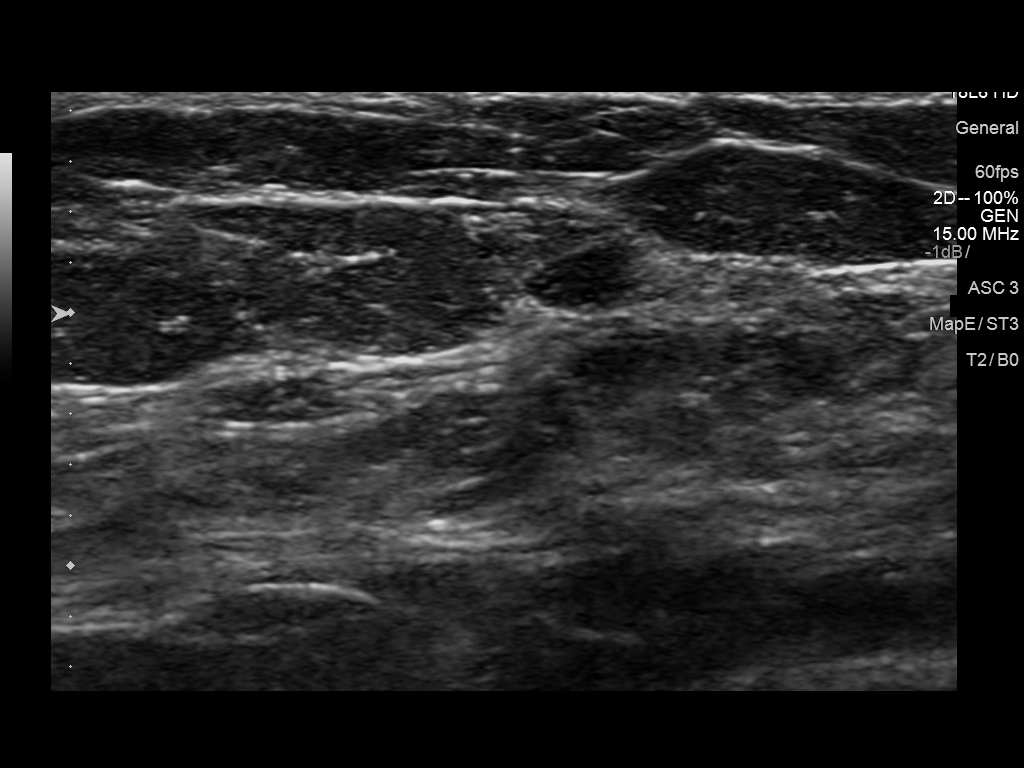
[im 7/7]
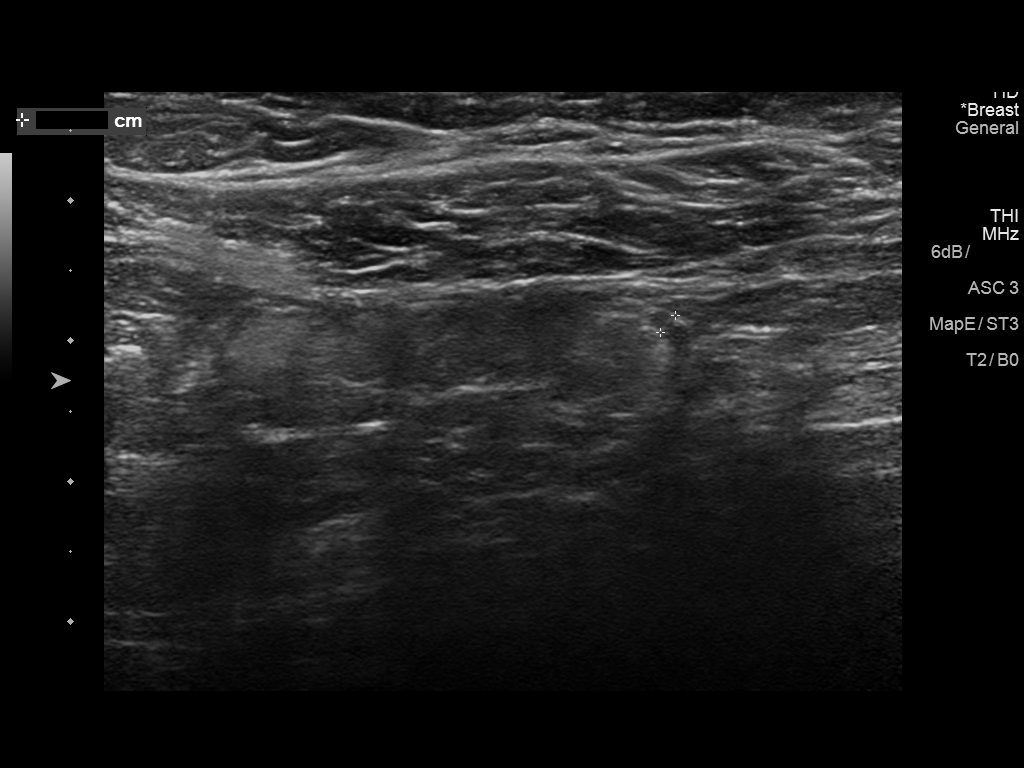

[7 of 7 positions shown; findings below may reference images not displayed]

ACR Breast Density Category b: There are scattered areas of
fibroglandular density.
FINDINGS: Mammographically, there are no suspicious masses, areas of
architectural distortion or microcalcifications in the left breast.
The previously seen asymmetry in the superior left breast is less
prominent and benign in appearance.

Mammographic images were processed with CAD.

On physical exam, no suspicious masses are found.

Targeted ultrasound is performed, showing no suspicious masses
shadowing lesions. In the left breast o'clock 4 cm from the nipple
there is a 0.4 by 0.3 by 0.3 cm benign-appearing nodule.
Additionally, in the left breast 2 o'clock 5 cm from the nipple
there is a 0.5 by 0.8 by 0.2 cm prominent breast lobule versus a
benign-appearing mass. I believe the 4 mm nodule may correspond to
the mammographically seen asymmetry.
IMPRESSION: Probably benign left breast findings, for which six-month follow-up
is recommended.

RECOMMENDATION:
Diagnostic mammogram and possibly ultrasound of the left breast in 6
months. (Code:EG-A-AA8)

I have discussed the findings and recommendations with the patient.
Results were also provided in writing at the conclusion of the
visit. If applicable, a reminder letter will be sent to the patient
regarding the next appointment.

BI-RADS CATEGORY  3: Probably benign.

## 2017-07-01 ENCOUNTER — Ambulatory Visit: Payer: Self-pay | Admitting: Physician Assistant

## 2017-07-01 DIAGNOSIS — Z299 Encounter for prophylactic measures, unspecified: Secondary | ICD-10-CM

## 2017-07-01 NOTE — Progress Notes (Signed)
Patient came in to get blood drawn for testing per Dr. Juliane Lack Raynor's orders.

## 2017-07-03 LAB — URINE CULTURE

## 2017-07-26 ENCOUNTER — Ambulatory Visit: Payer: Self-pay | Admitting: Emergency Medicine

## 2017-07-26 ENCOUNTER — Ambulatory Visit
Admission: RE | Admit: 2017-07-26 | Discharge: 2017-07-26 | Disposition: A | Discharge status: Home / Self Care | Payer: Managed Care, Other (non HMO) | Source: Ambulatory Visit | Attending: Obstetrics and Gynecology | Admitting: Obstetrics and Gynecology

## 2017-07-26 VITALS — BP 100/60 | HR 117 | Temp 98.8°F | Resp 16

## 2017-07-26 DIAGNOSIS — R509 Fever, unspecified: Secondary | ICD-10-CM

## 2017-07-26 DIAGNOSIS — E860 Dehydration: Secondary | ICD-10-CM | POA: Insufficient documentation

## 2017-07-26 DIAGNOSIS — B349 Viral infection, unspecified: Secondary | ICD-10-CM

## 2017-07-26 LAB — POCT URINALYSIS DIPSTICK
Glucose, UA: NEGATIVE
Leukocytes, UA: NEGATIVE
NITRITE UA: NEGATIVE
PH UA: 6 (ref 5.0–8.0)
UROBILINOGEN UA: 1 U/dL

## 2017-07-26 LAB — POCT INFLUENZA A/B
Influenza A, POC: NEGATIVE
Influenza B, POC: NEGATIVE

## 2017-07-26 MED ORDER — SODIUM CHLORIDE 0.9 % IV SOLN
INTRAVENOUS | Status: AC
  Administered 2017-07-26: 14:00:00 via INTRAVENOUS

## 2017-07-26 MED ORDER — HEPARIN SOD (PORK) LOCK FLUSH 100 UNIT/ML IV SOLN
INTRAVENOUS | Status: AC
  Administered 2017-07-26: 15:00:00
  Filled 2017-07-26: qty 5

## 2017-07-26 MED ORDER — HEPARIN SOD (PORK) LOCK FLUSH 100 UNIT/ML IV SOLN: 500.0000 [IU] | Freq: Once | INTRAVENOUS | Status: DC

## 2017-07-26 NOTE — Progress Notes (Signed)
S:  Patient is here with several days of body aches, nausea, and fever. Patient denies any vomiting or diarrhea.  She admits to deceased fluid intake due to not feeling well.  Occasional nonproductive cough. She has been taking some over-the-medication without complete relief of her symptoms. O:  TMs dull bilat, nose clear, throat without erythema or exudate. Neck is supple without adenopathy Lungs are clear bilaterally. Heart regular rate and rhythm without murmur. Patient pulse rate in the clinic 117. A:  Viral like illness P:  Out patient fluids.  Patient to hospital with instructions.  Patient is encouraged to continue fluids at home.   OTC meds for symptoms.  Urine with ketones, neg nit and leuks.  Flu swab negative. Follow up here or PCP if not improving.

## 2017-07-26 NOTE — Addendum Note (Signed)
Addended by: Rudene Anda T on: 07/26/2017 01:56 PM   Modules accepted: Orders

## 2017-12-14 DEATH — deceased
